# Patient Record
Sex: Female | Born: 1950 | Race: Black or African American | Hispanic: No
Health system: Southern US, Community
[De-identification: ages and names within clinical notes are randomized; demographics above are authoritative.]

---

## 2001-02-05 ENCOUNTER — Other Ambulatory Visit: Admission: RE | Admit: 2001-02-05 | Discharge: 2001-02-05 | Payer: Self-pay | Admitting: Family Medicine

## 2004-04-15 ENCOUNTER — Ambulatory Visit: Payer: Self-pay | Admitting: Family Medicine

## 2005-05-02 ENCOUNTER — Ambulatory Visit: Payer: Self-pay | Admitting: Family Medicine

## 2006-04-12 ENCOUNTER — Ambulatory Visit: Payer: Self-pay | Admitting: Gastroenterology

## 2006-05-03 ENCOUNTER — Ambulatory Visit: Payer: Self-pay | Admitting: Family Medicine

## 2008-04-09 ENCOUNTER — Ambulatory Visit: Payer: Self-pay | Admitting: Internal Medicine

## 2008-07-06 ENCOUNTER — Inpatient Hospital Stay: Payer: Self-pay | Admitting: Surgery

## 2008-07-06 IMAGING — US ABDOMEN ULTRASOUND
1 series · 17 of 25 positions shown · non-contrast
Comparison: none

REASON FOR EXAM: jaundice, fever, abd pain
COMMENTS:

[Series 1: abdomen ultrasound · 17 of 67 slices shown]
[im 1/67]
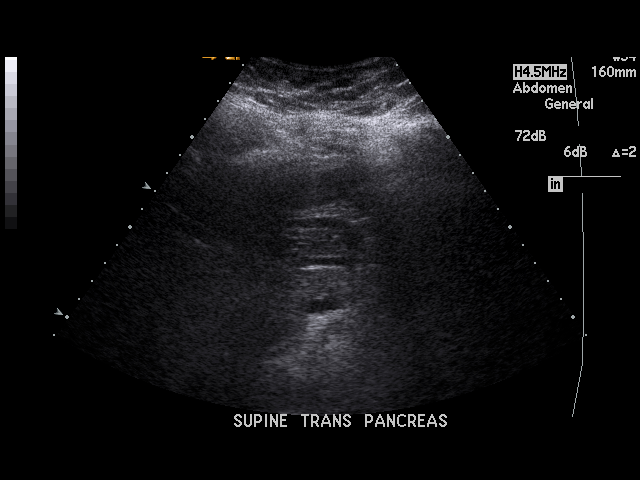
[im 6/67]
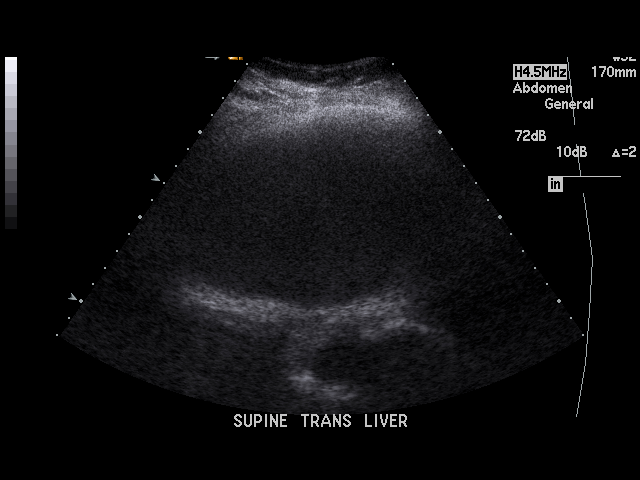
[im 9/67]
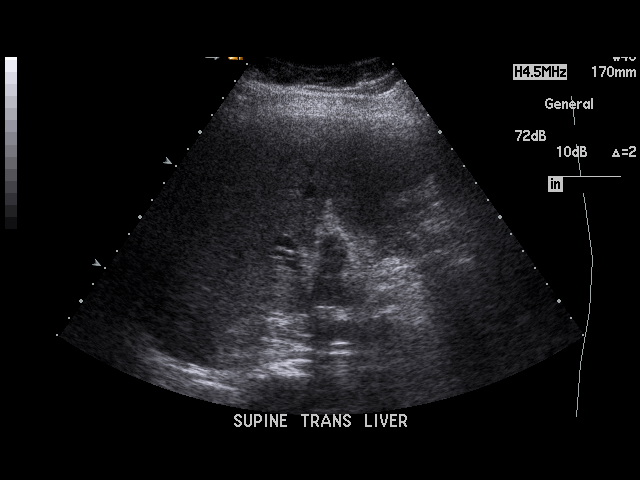
[im 14/67]
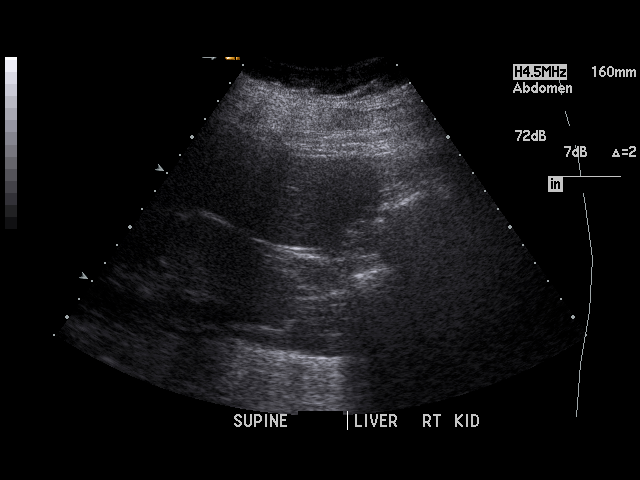
[im 17/67]
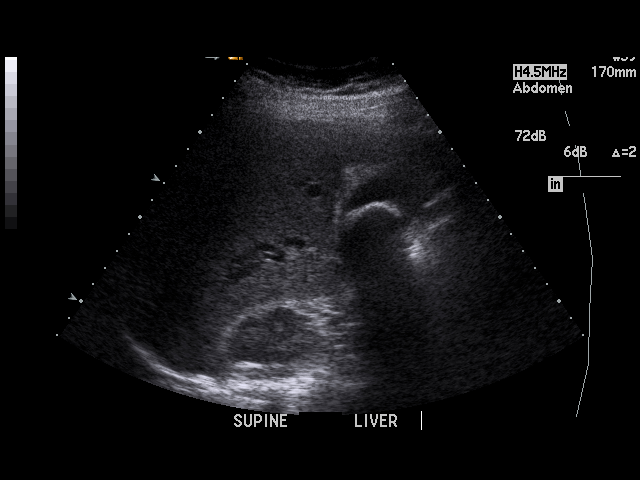
[im 23/67]
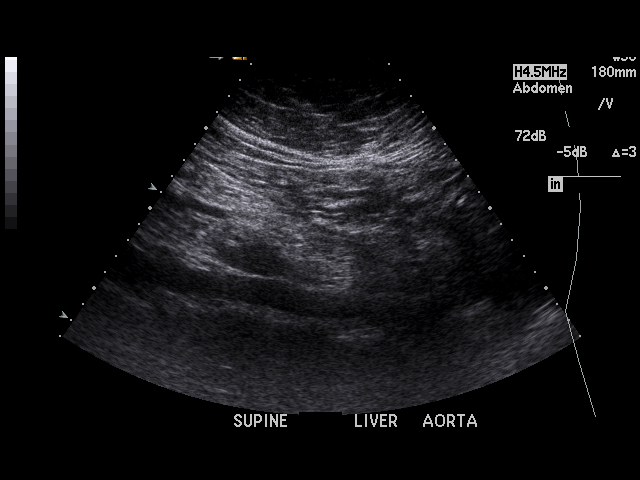
[im 25/67]
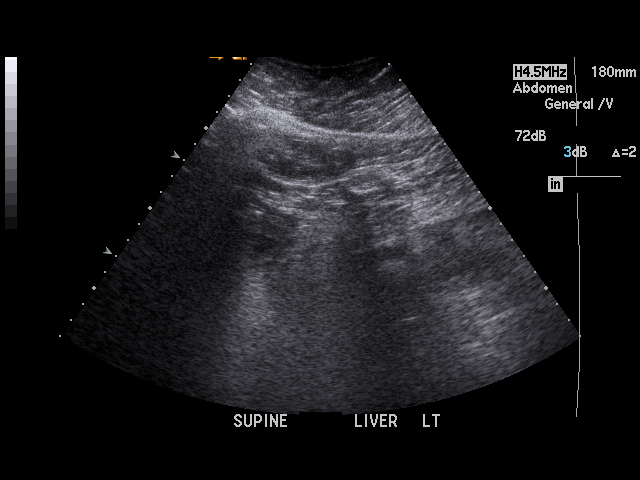
[im 31/67]
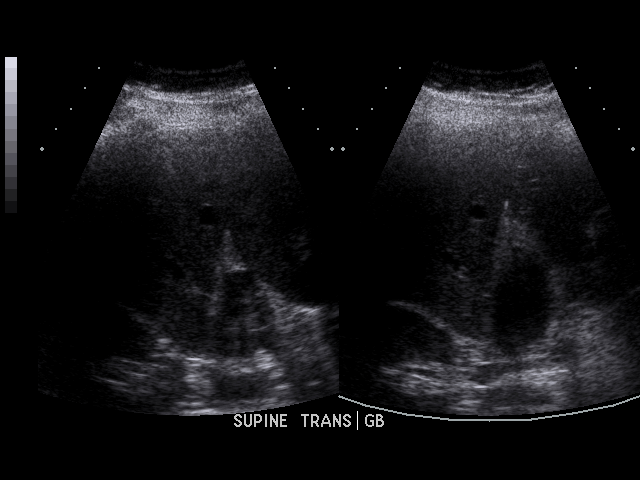
[im 34/67]
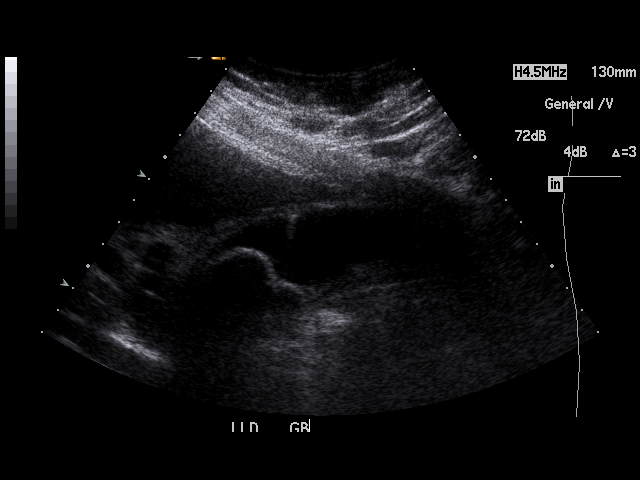
[im 36/67]
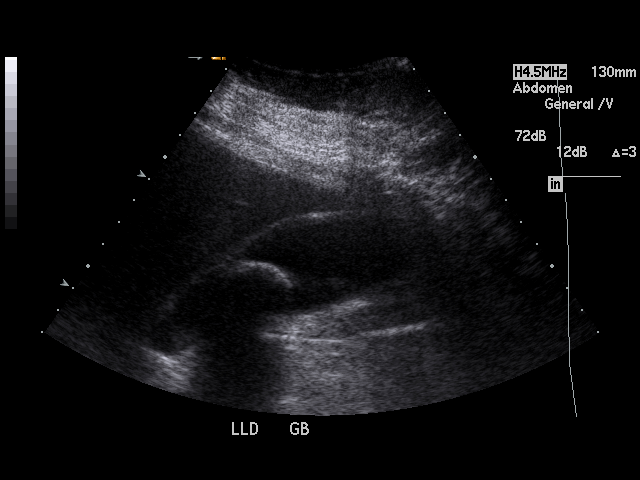
[im 42/67]
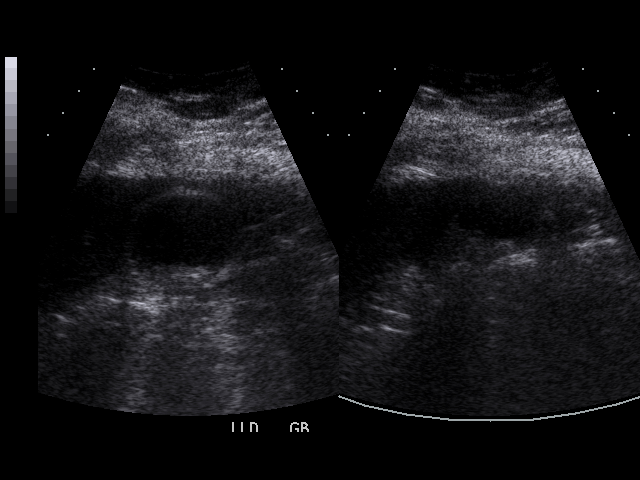
[im 45/67]
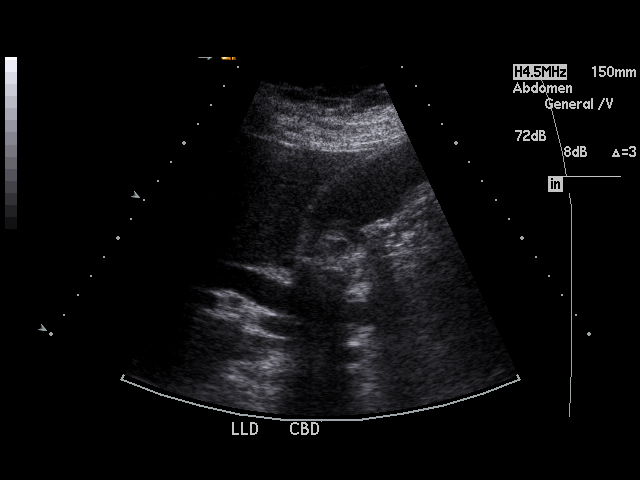
[im 50/67]
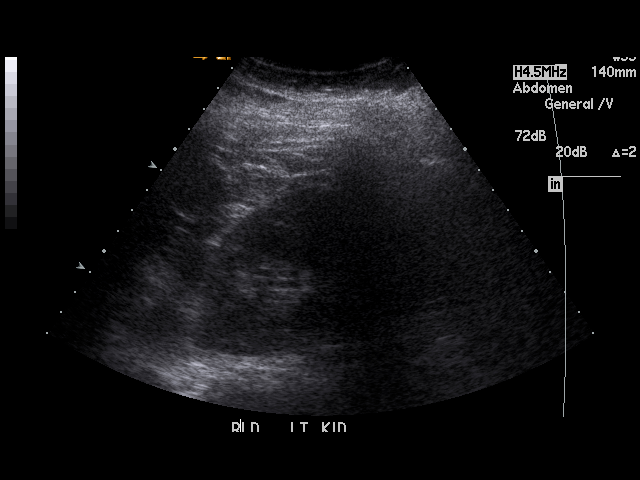
[im 53/67]
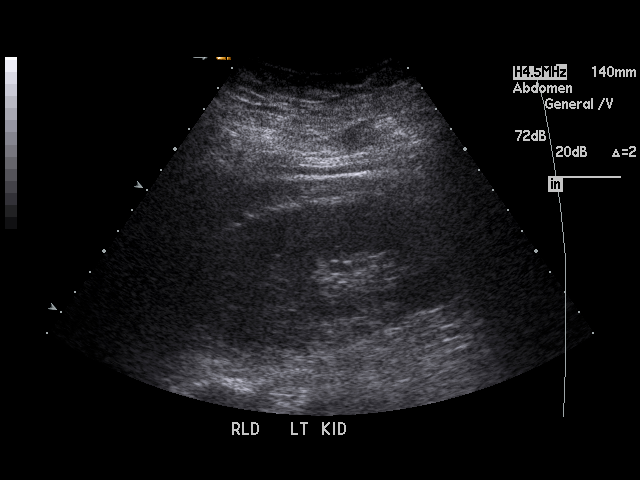
[im 58/67]
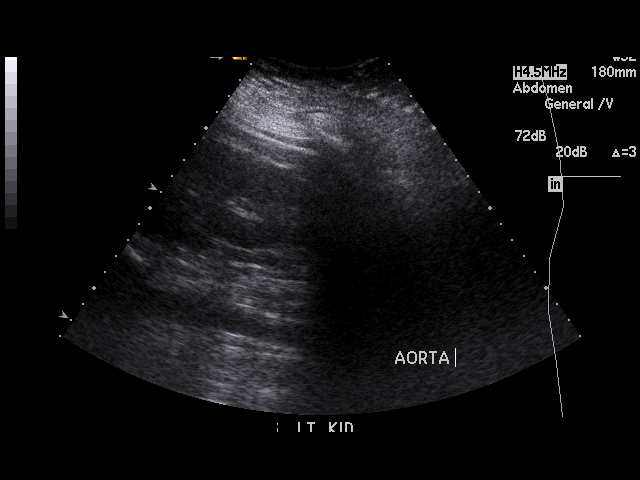
[im 61/67]
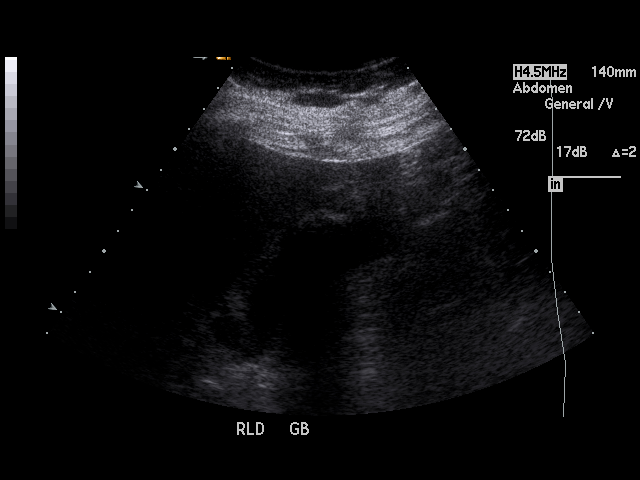
[im 67/67]
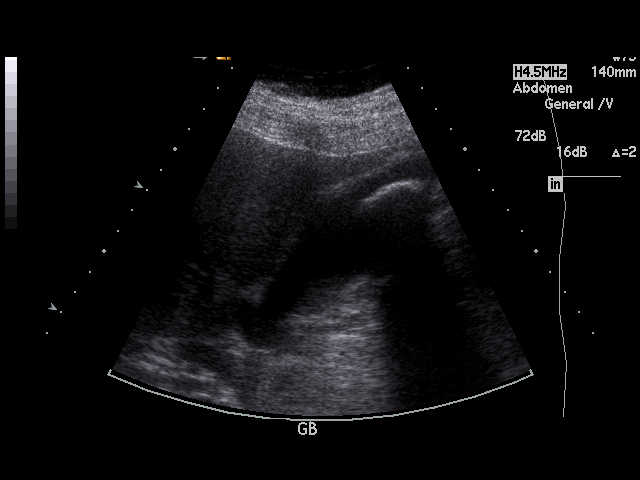

[17 of 25 positions shown; findings below may reference images not displayed]

PROCEDURE:     US  - US ABDOMEN GENERAL SURVEY  - [DATE]  [DATE]

RESULT:     The liver is fatty replaced. The gallbladder contains stones.
Gallbladder wall thickness is 5.8 mm which is thickened. The common bile
duct measures 1.08 cm which is dilated. No stone is noted within the common
bile duct; however, choledocholithiasis could present in this fashion. There
is no hydronephrosis. The pancreatic duct is 3.7 mm which is prominent. The
pancreatic head is again not identified.
IMPRESSION: Gallstones with thickened gallbladder wall suggesting the
possibility of cholecystitis. The common bile duct is dilated.
Choledocholithiasis could present in this fashion. The pancreatic head is
not identified. There is mild pancreatic duct dilatation.

## 2009-05-07 ENCOUNTER — Ambulatory Visit: Payer: Self-pay | Admitting: Internal Medicine

## 2010-05-25 ENCOUNTER — Ambulatory Visit: Payer: Self-pay | Admitting: Internal Medicine

## 2012-04-20 ENCOUNTER — Ambulatory Visit: Payer: Self-pay | Admitting: Internal Medicine

## 2013-04-22 ENCOUNTER — Ambulatory Visit: Payer: Self-pay | Admitting: Internal Medicine

## 2013-10-16 ENCOUNTER — Ambulatory Visit: Payer: Self-pay | Admitting: Specialist

## 2013-12-16 ENCOUNTER — Ambulatory Visit: Payer: Self-pay | Admitting: Gastroenterology

## 2013-12-19 LAB — PATHOLOGY REPORT

## 2014-02-10 ENCOUNTER — Ambulatory Visit: Payer: Self-pay | Admitting: Specialist

## 2014-02-10 LAB — CBC WITH DIFFERENTIAL/PLATELET
BASOS PCT: 0.9 %
Basophil #: 0 10*3/uL (ref 0.0–0.1)
EOS PCT: 1.2 %
Eosinophil #: 0.1 10*3/uL (ref 0.0–0.7)
HCT: 39.2 % (ref 35.0–47.0)
HGB: 12.5 g/dL (ref 12.0–16.0)
LYMPHS ABS: 1.5 10*3/uL (ref 1.0–3.6)
Lymphocyte %: 28.9 %
MCH: 30.1 pg (ref 26.0–34.0)
MCHC: 31.9 g/dL — AB (ref 32.0–36.0)
MCV: 94 fL (ref 80–100)
MONO ABS: 0.7 x10 3/mm (ref 0.2–0.9)
MONOS PCT: 13.4 %
NEUTROS ABS: 2.9 10*3/uL (ref 1.4–6.5)
Neutrophil %: 55.6 %
PLATELETS: 329 10*3/uL (ref 150–440)
RBC: 4.16 10*6/uL (ref 3.80–5.20)
RDW: 14.4 % (ref 11.5–14.5)
WBC: 5.2 10*3/uL (ref 3.6–11.0)

## 2014-02-10 LAB — BASIC METABOLIC PANEL
ANION GAP: 6 — AB (ref 7–16)
BUN: 19 mg/dL — ABNORMAL HIGH (ref 7–18)
CREATININE: 0.68 mg/dL (ref 0.60–1.30)
Calcium, Total: 8.9 mg/dL (ref 8.5–10.1)
Chloride: 101 mmol/L (ref 98–107)
Co2: 31 mmol/L (ref 21–32)
EGFR (African American): >60
EGFR (Non-African Amer.): >60
Glucose: 123 mg/dL — ABNORMAL HIGH (ref 65–99)
OSMOLALITY: 279 (ref 275–301)
Potassium: 3.6 mmol/L (ref 3.5–5.1)
Sodium: 138 mmol/L (ref 136–145)

## 2014-02-18 ENCOUNTER — Inpatient Hospital Stay: Payer: Self-pay | Admitting: Specialist

## 2014-02-18 LAB — CREATININE, SERUM
Creatinine: 0.61 mg/dL (ref 0.60–1.30)
EGFR (African American): >60
EGFR (Non-African Amer.): >60

## 2014-02-19 LAB — CBC WITH DIFFERENTIAL/PLATELET
BASOS PCT: 0.8 %
Basophil #: 0.1 10*3/uL (ref 0.0–0.1)
EOS ABS: 0 10*3/uL (ref 0.0–0.7)
Eosinophil %: 0 %
HCT: 39.1 % (ref 35.0–47.0)
HGB: 12.5 g/dL (ref 12.0–16.0)
Lymphocyte #: 0.7 10*3/uL — ABNORMAL LOW (ref 1.0–3.6)
Lymphocyte %: 9 %
MCH: 30.2 pg (ref 26.0–34.0)
MCHC: 31.9 g/dL — AB (ref 32.0–36.0)
MCV: 95 fL (ref 80–100)
Monocyte #: 0.4 x10 3/mm (ref 0.2–0.9)
Monocyte %: 4.7 %
NEUTROS ABS: 7 10*3/uL — AB (ref 1.4–6.5)
Neutrophil %: 85.5 %
PLATELETS: 298 10*3/uL (ref 150–440)
RBC: 4.12 10*6/uL (ref 3.80–5.20)
RDW: 14.5 % (ref 11.5–14.5)
WBC: 8.2 10*3/uL (ref 3.6–11.0)

## 2014-02-19 LAB — BASIC METABOLIC PANEL
Anion Gap: 15 (ref 7–16)
BUN: 7 mg/dL (ref 7–18)
CALCIUM: 8.3 mg/dL — AB (ref 8.5–10.1)
Chloride: 103 mmol/L (ref 98–107)
Co2: 18 mmol/L — ABNORMAL LOW (ref 21–32)
Creatinine: 0.83 mg/dL (ref 0.60–1.30)
EGFR (African American): >60
EGFR (Non-African Amer.): >60
Glucose: 218 mg/dL — ABNORMAL HIGH (ref 65–99)
Osmolality: 277 (ref 275–301)
POTASSIUM: 3.8 mmol/L (ref 3.5–5.1)
SODIUM: 136 mmol/L (ref 136–145)

## 2014-02-19 LAB — MAGNESIUM: Magnesium: 1.6 mg/dL — ABNORMAL LOW

## 2014-02-19 LAB — PHOSPHORUS: PHOSPHORUS: 2.6 mg/dL (ref 2.5–4.9)

## 2014-02-19 LAB — ALBUMIN: ALBUMIN: 2.8 g/dL — AB (ref 3.4–5.0)

## 2014-05-29 ENCOUNTER — Ambulatory Visit: Payer: Self-pay | Admitting: Internal Medicine

## 2014-07-12 NOTE — Discharge Summary (Signed)
Dates of Admission and Diagnosis:  Date of Admission 18-Feb-2014   Date of Discharge 19-Feb-2014   Admitting Diagnosis Morbid Obesity   Final Diagnosis Same    Chief Complaint/History of Present Illness Morbid Obesity and Comorbidities   Allergies:  Aspirin: Other  Statins: Unknown  Pertinent Past History:  Pertinent Past History Morbid Obesity and Comorbidities, failed PSWL   Hospital Course:  Hospital Course Admitted for routine post op care.  UGI on POD#1 without leak/obstruction.  Progressed through protocols.  Demonstrated PO intake, ambulation, void, pain/nausea control.  Denied CP, SOB, Dyspnea, or Leg Pain.  Anxious toward and appropriate by criteria for DC home.   Condition on Discharge Stable   DISCHARGE INSTRUCTIONS HOME MEDS:  Medication Reconciliation: Patient's Home Medications at Discharge:     Medication Instructions  acetaminophen-hydrocodone  15 milliliter(s) orally every 4 hours, As needed, moderate pain (4-6/10)    STOP TAKING THE FOLLOWING MEDICATION(S):    metformin 1000 mg oral tablet: 1  orally 2 times a day  hydrochlorothiazide 25 mg oral tablet: 1  orally once a day  levemir 100 units/ml subcutaneous solution: 80 unit(s) subcutaneous once a day (at bedtime) vitamin d3 5000 intl units oral capsule: 1 cap(s) orally once a day magnesium 500 mg: 1 tab(s) orally once a day  Physician's Instructions:  Home Health? No   Dressing Care Replace dressing as necessary.  May shower.   Home Oxygen? No   Diet Bariatric Clear Liquids   Diet Special Instructions Limit to Bariatric Clear Liquids.  Goal of 1-2oz Q10-15 minutes as tolerated   Activity Limitations As tolerated   Return to Work after follow up visit with MD  Or if not requiring narcotic pain medication   Time frame for Follow Up Appointment 2-4 weeks  with Dr. Smitty CordsBruce   Time frame for Follow Up Appointment 1-2 weeks  with Primary Care, call to PCP for daily insulin/anti-glycemic regimen  and/or PRN abnormal FSBS coverage   Electronic Signatures: Everette Rankyner, Michael A (MD)  (Signed 02-Dec-15 17:32)  Authored: ADMISSION DATE AND DIAGNOSIS, CHIEF COMPLAINT/HPI, Allergies, PERTINENT PAST HISTORY, HOSPITAL COURSE, DISCHARGE INSTRUCTIONS HOME MEDS, PATIENT INSTRUCTIONS   Last Updated: 02-Dec-15 17:32 by Everette Rankyner, Michael A (MD)

## 2014-07-12 NOTE — Op Note (Signed)
PATIENT NAME:  Tonya Garcia, Tonya Garcia MR#:  [id number] DATE OF BIRTH:  [birth year]  DATE OF PROCEDURE:  02/18/2014  PREOPERATIVE DIAGNOSES:  1.  Morbid obesity.  2.  Previous splenectomy and cholecystectomy.   POSTOPERATIVE DIAGNOSES:  1.  Severe intra-abdominal adhesions.  2.  Hiatal hernia.   PROCEDURE: Laparoscopic sleeve gastrectomy, laparoscopic hiatal hernia repair, and laparoscopic extensive lysis of adhesions.   SURGEON: Primus BravoJon Latish Toutant, MD   ASSISTANT: Carrolyn LeighSarah A. Stout, PA-C   COMPLICATIONS: None.   ANESTHESIA: General endotracheal.   ESTIMATED BLOOD LOSS: None.   SPECIMENS: None.   DRAINS: None.   SPECIMENS: Portion of stomach.   CLINICAL HISTORY: Tonya Garcia is a 64 year old female with a history of prior splenectomy and cholecystectomy, open, who requests laparoscopic morbid obesity surgery. She has a known hiatal hernia which probably needs repair. I counseled her on the increased risk of complications with her previous abdominal surgery, but in my experience we should be able to get the surgery done. The risks and benefits were discussed with her to include death, myocardial infarction, venous thromboembolic disease, bleeding, infection, as well as leak, need for more surgery, as well as other complications and she elected to proceed.   DETAILS OF PROCEDURE: The patient was taken to the Operating Room, placed on the operating table in the supine position. Appropriate monitors and supplemental oxygen delivered. Broad-spectrum IV antibiotics were administered. Sequential stockings were placed. The patient was placed under general anesthesia without incident. A timeout was performed. The abdomen was prepped and draped in the usual sterile fashion. Access using a 5 mm optical trocar on the right side of the abdomen as the patient had a previous big transverse left-sided incision associated  with her splenectomy. Pneumoperitoneum was established. Multiple other incisions were made in  preparation for the surgery. A loop of small intestine was severely adhesed to the abdominal wall. I took that down sharply using scissors without advertent more obvious injury to the small bowel. Once this was freed, I had a clear plane to the stomach; however, the stomach was adhesed posteriorly and laterally and fairly strongly within the dense adhesions. These were taken down using the Harmonic scalpel freeing the underside of the stomach which the lesser sac was pretty much obliterated and the greater curvature. I did this all the way to the fundus as best as possible to a point I felt safe and then stopped that. I continued inferiorly a little bit easier using the Harmonic scalpel to a distance of about 5 to 6 cm from the pylorus. I then explored the hiatus, as she had an obvious hiatal hernia which was not large, I would say moderate in nature. The right and left crura were explored and closed using interrupted Surgidac sutures. At that point, I bisected the antrum with a 36 French bougie in place in the antrum to avoid impinging on the incisura. This was done with the echelon green load stapler, reinforced with Seamguard. Buttressing was used in the multiple fires using gold load and we used more staple fires than usual just because of the irregular anatomy of the stomach. She ended up with a nice looking sleeve. I did not see any evidence of leak or compromised stomach. The excess stomach was brought out through the 15 mm port. The trocars and liver retractor were removed. The wounds were closed using 4-0 Vicryl and Dermabond. The patient tolerated the procedure well and arrived to the recovery room in stable condition.    ____________________________ Cletis AthensJon  Smitty Cords, MD jb:at D: 02/18/2014 14:43:23 ET T: 02/18/2014 15:32:28 ET JOB#: [id number]  cc: Primus Bravo, MD, <Dictator> Geoffry Paradise MD ELECTRONICALLY SIGNED 02/18/2014 17:46

## 2014-07-14 LAB — SURGICAL PATHOLOGY

## 2017-02-03 ENCOUNTER — Other Ambulatory Visit: Payer: Self-pay | Admitting: Internal Medicine

## 2017-02-03 ENCOUNTER — Ambulatory Visit
Admission: RE | Admit: 2017-02-03 | Discharge: 2017-02-03 | Disposition: A | Discharge status: Home / Self Care | Payer: Medicare Other | Source: Ambulatory Visit | Attending: Internal Medicine | Admitting: Internal Medicine

## 2017-02-03 DIAGNOSIS — Z1231 Encounter for screening mammogram for malignant neoplasm of breast: Secondary | ICD-10-CM
